# Patient Record
Sex: Female | Born: 1935 | Race: White | Hispanic: No
Health system: Southern US, Community
[De-identification: ages and names within clinical notes are randomized; demographics above are authoritative.]

---

## 2003-12-10 ENCOUNTER — Ambulatory Visit: Payer: Self-pay | Admitting: Internal Medicine

## 2005-01-26 ENCOUNTER — Ambulatory Visit: Payer: Self-pay | Admitting: Internal Medicine

## 2005-03-12 ENCOUNTER — Emergency Department: Payer: Self-pay | Admitting: Emergency Medicine

## 2006-12-03 ENCOUNTER — Emergency Department: Payer: Self-pay | Admitting: Emergency Medicine

## 2006-12-03 ENCOUNTER — Other Ambulatory Visit: Payer: Self-pay

## 2006-12-19 ENCOUNTER — Other Ambulatory Visit: Payer: Self-pay

## 2006-12-19 ENCOUNTER — Emergency Department: Payer: Self-pay | Admitting: Emergency Medicine

## 2007-01-18 ENCOUNTER — Inpatient Hospital Stay: Payer: Self-pay | Admitting: Internal Medicine

## 2007-01-18 ENCOUNTER — Other Ambulatory Visit: Payer: Self-pay

## 2007-01-19 ENCOUNTER — Other Ambulatory Visit: Payer: Self-pay

## 2007-01-20 ENCOUNTER — Other Ambulatory Visit: Payer: Self-pay

## 2009-09-13 IMAGING — CT CT HEAD WITHOUT CONTRAST
2 series · 16 of 30 positions shown, 20 images · non-contrast
Comparison: none

REASON FOR EXAM: dizzy
COMMENTS:

PROCEDURE:     CT  - CT HEAD WITHOUT CONTRAST  - December 03, 2006 [DATE]
RESULT:
HISTORY: Dizzy.
COMPARISON STUDIES: No recent.
PROCEDURE AND FINDINGS: No intra-axial or extra-axial pathologic fluid or
blood collections identified.  No mass lesions or hydrocephalus is noted. No
bony abnormalities are identified. There is no evidence of fracture.

[Series 2: without · axial · non-contrast · 0.46mm/px · z∈[-128,-4]mm · 13 of 31 slices shown, 17 images]
[im 3/31  brain]
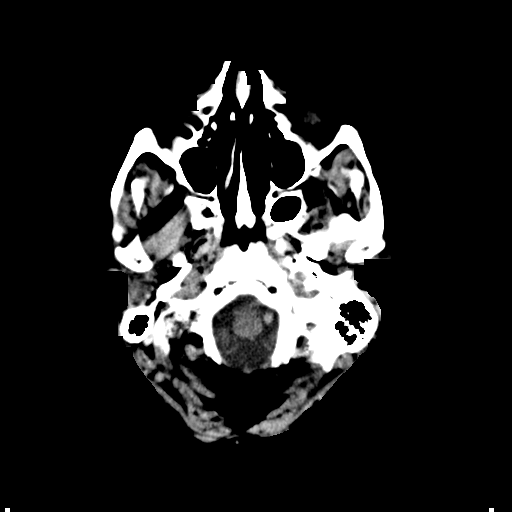
[im 3/31  bone]
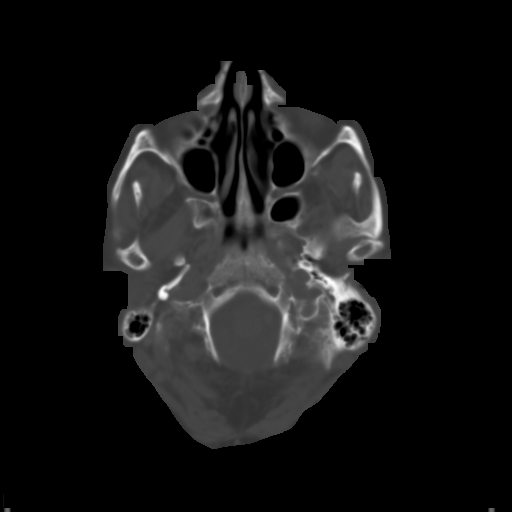
[im 5/31  brain]
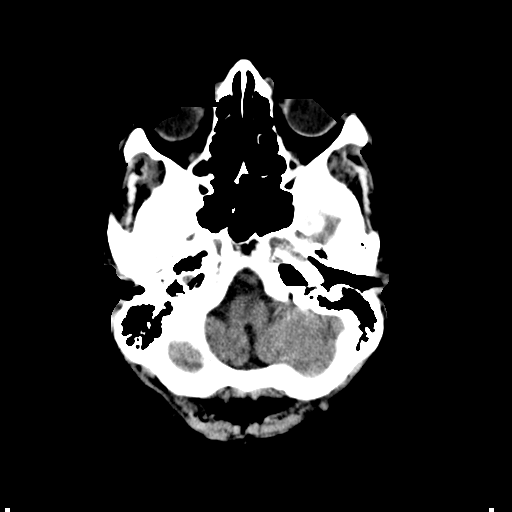
[im 7/31  brain]
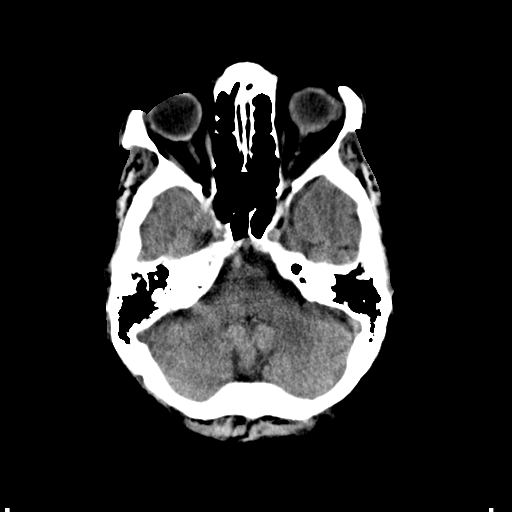
[im 9/31  brain]
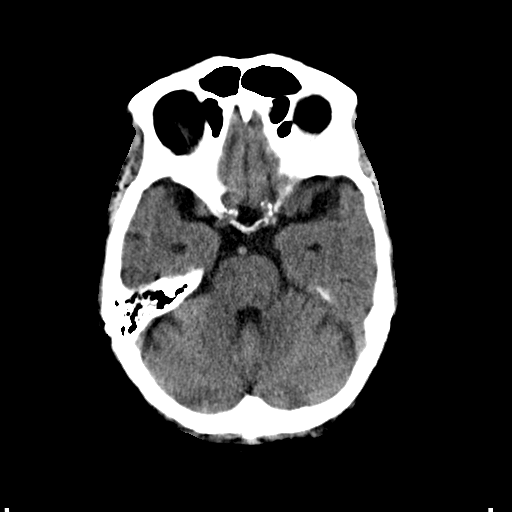
[im 11/31  brain]
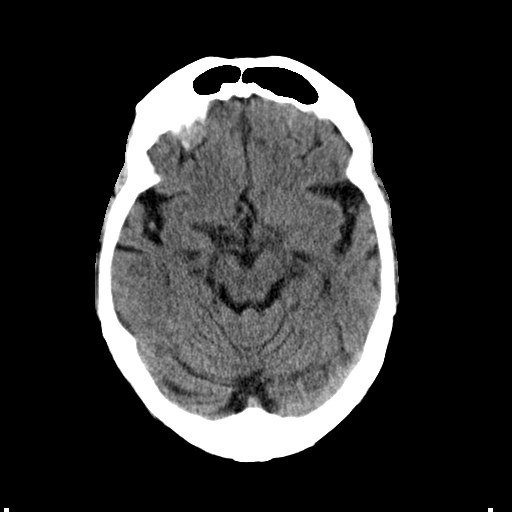
[im 11/31  bone]
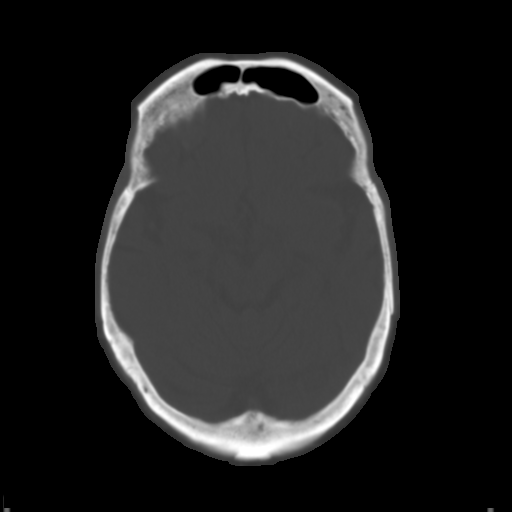
[im 13/31  brain]
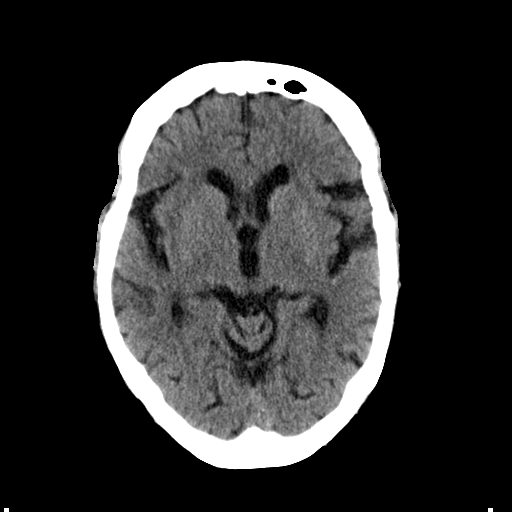
[im 16/31  brain]
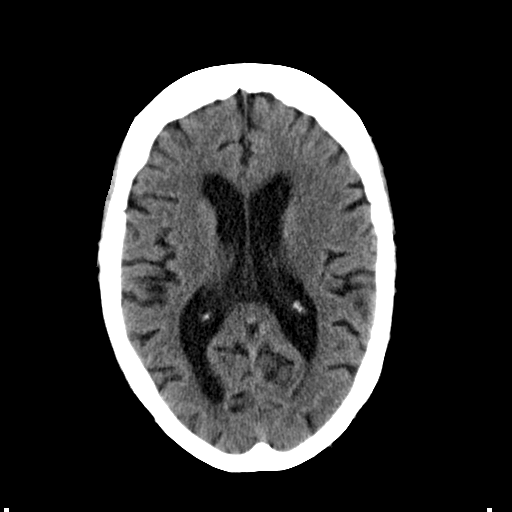
[im 18/31  brain]
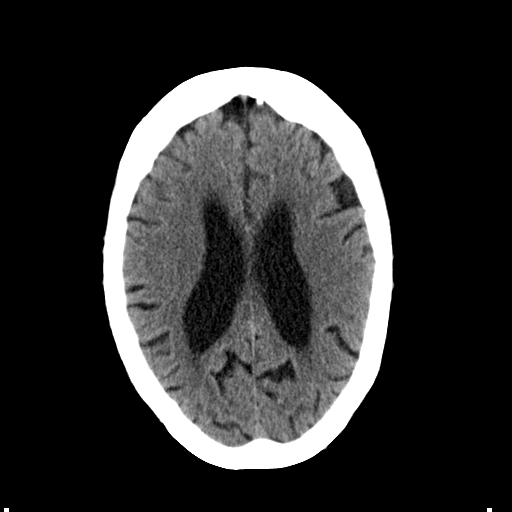
[im 20/31  brain]
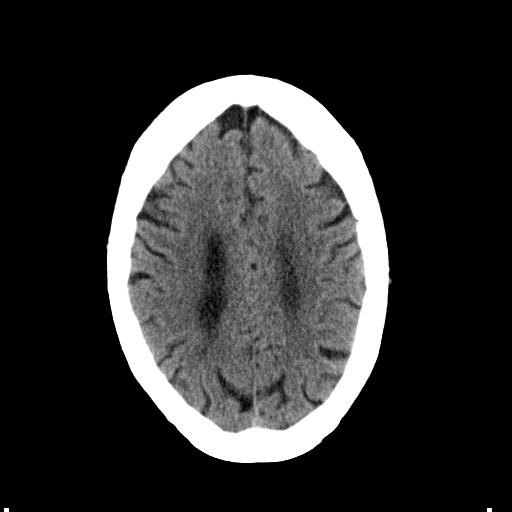
[im 20/31  bone]
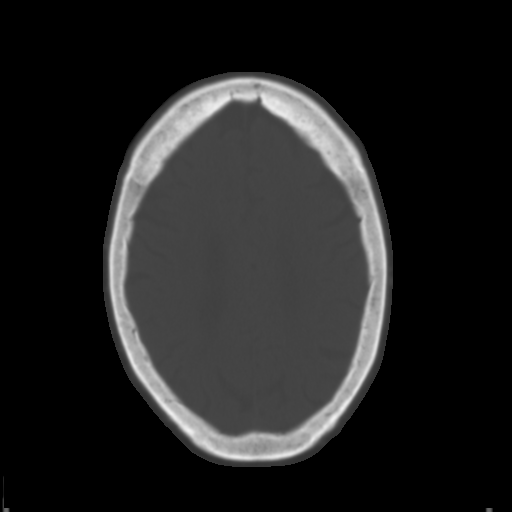
[im 22/31  brain]
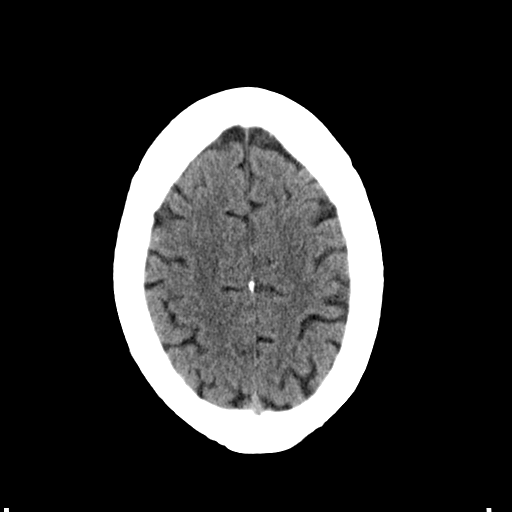
[im 24/31  brain]
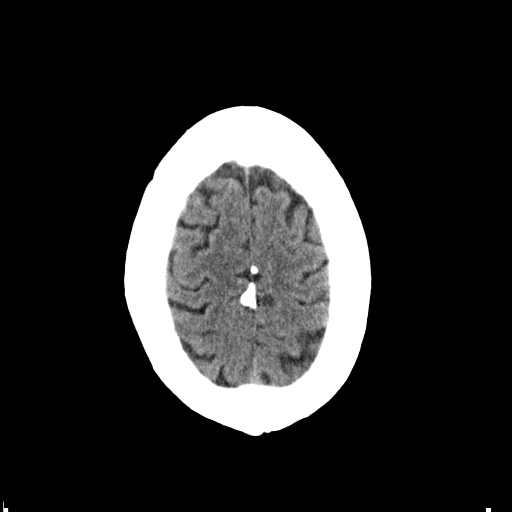
[im 26/31  brain]
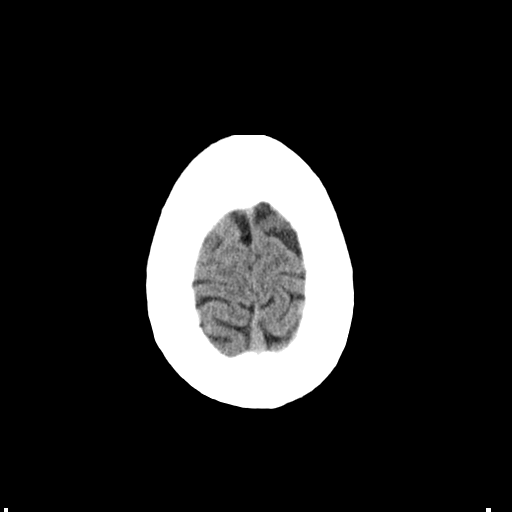
[im 28/31  brain]
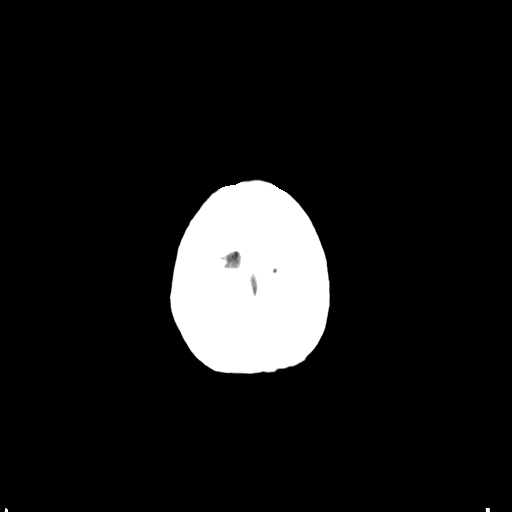
[im 28/31  bone]
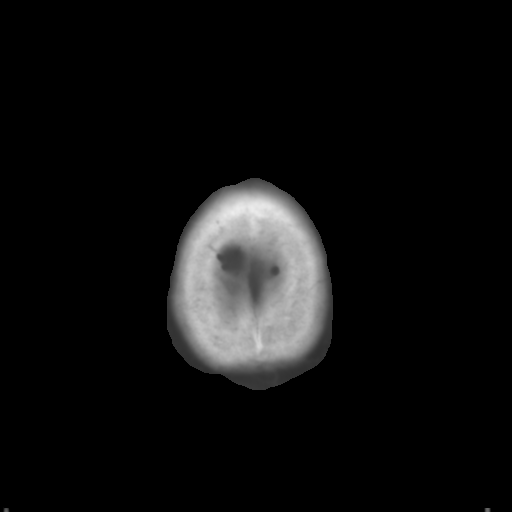

[Series 3: bone · axial · 0.46mm/px · z∈[-128,-88]mm · 3 of 31 slices shown]
[im 3/31  bone]
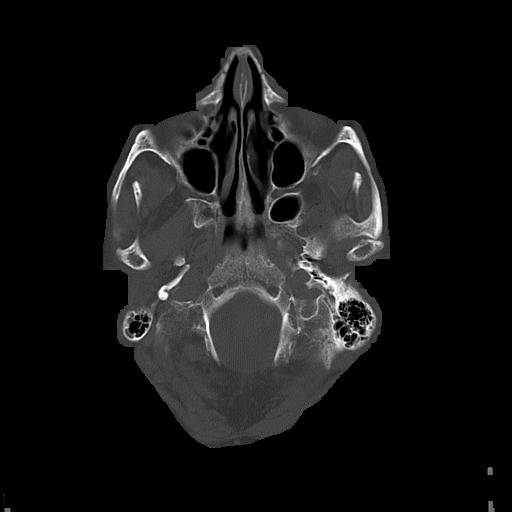
[im 7/31  bone]
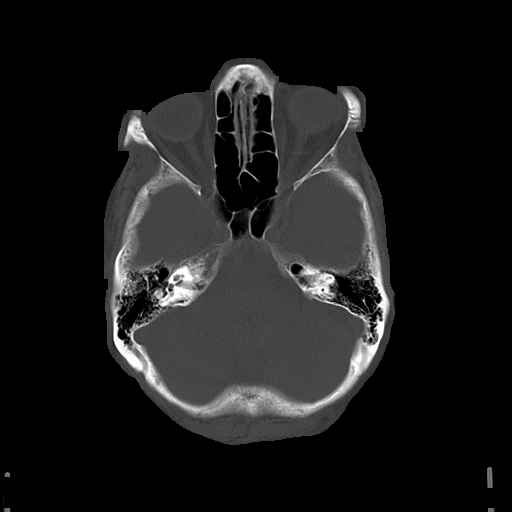
[im 11/31  bone]
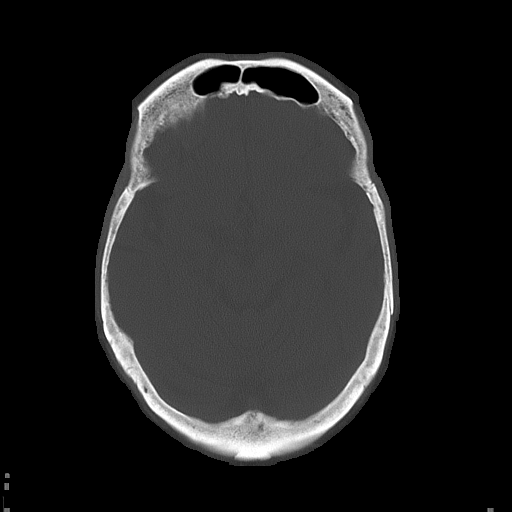

[16 of 30 positions shown; findings below may reference images not displayed]

IMPRESSION: 1)No acute abnormalities identified.

## 2009-09-29 IMAGING — CT CT HEAD WITHOUT CONTRAST
1 series · 16 of 30 positions shown, 20 images · non-contrast
Comparison: none

REASON FOR EXAM: syncope
COMMENTS:

PROCEDURE:     CT  - CT HEAD WITHOUT CONTRAST  - December 19, 2006  [DATE]
RESULT:     Comparison: Head CT on 12/03/2006.
Procedure: CT examination of the head was performed without intravenous
contrast. Collimation is 5 mm.

[Series 2: soft tissue · axial · 0.39mm/px · z∈[-272,-132]mm · 16 of 32 slices shown, 20 images]
[im 2/32  brain]
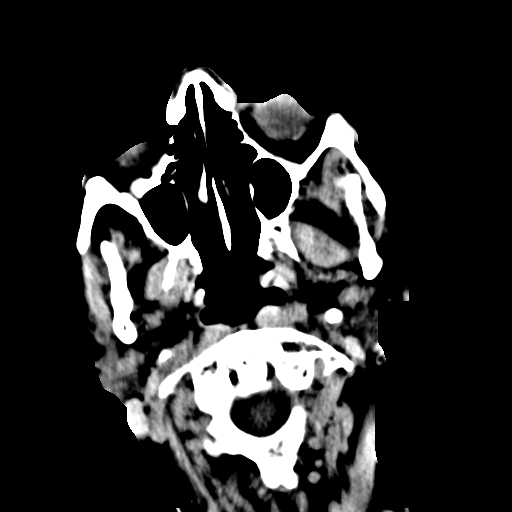
[im 2/32  bone]
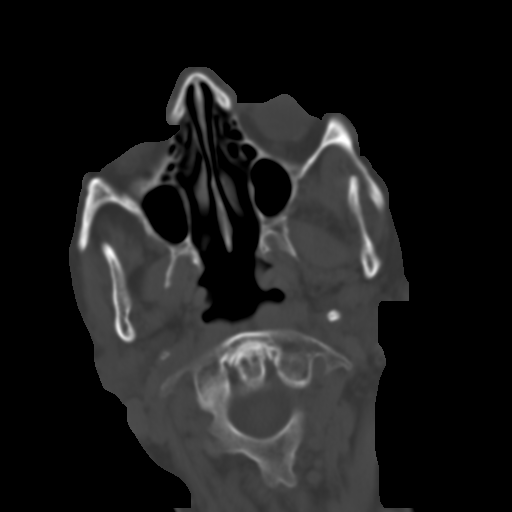
[im 4/32  brain]
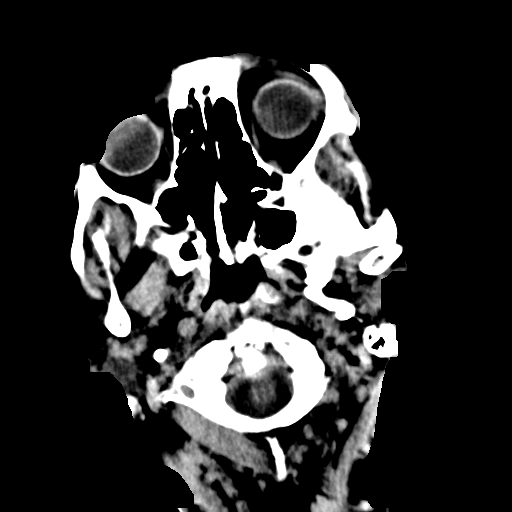
[im 6/32  brain]
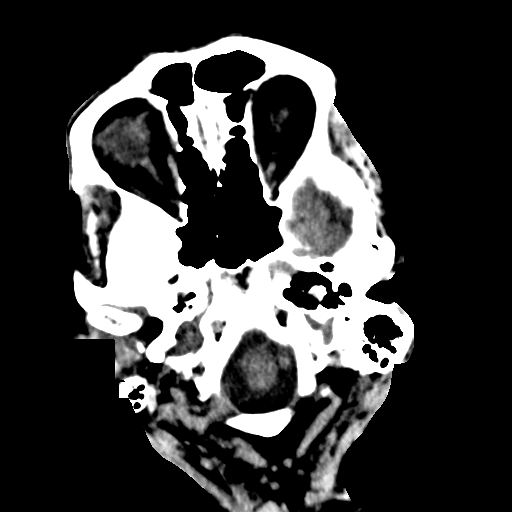
[im 8/32  brain]
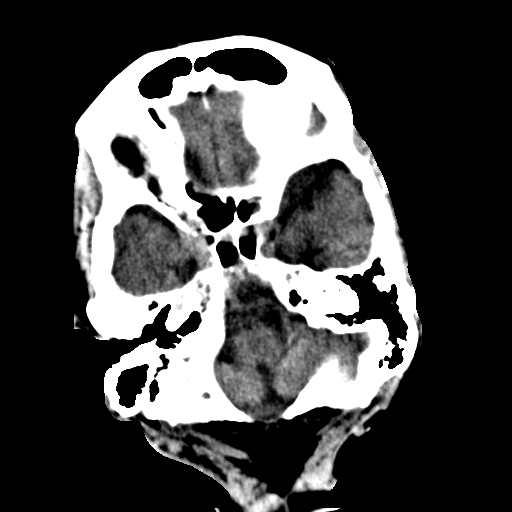
[im 9/32  brain]
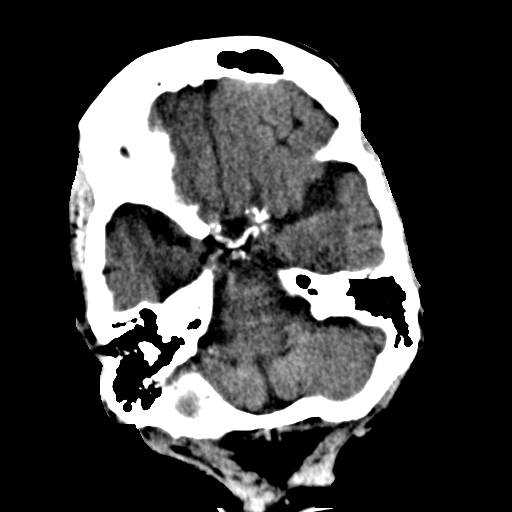
[im 9/32  bone]
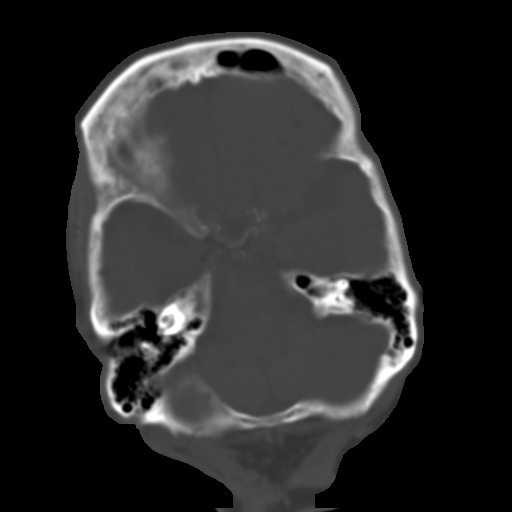
[im 11/32  brain]
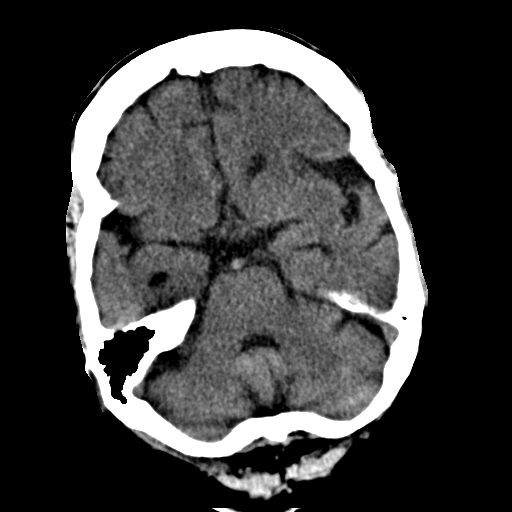
[im 13/32  brain]
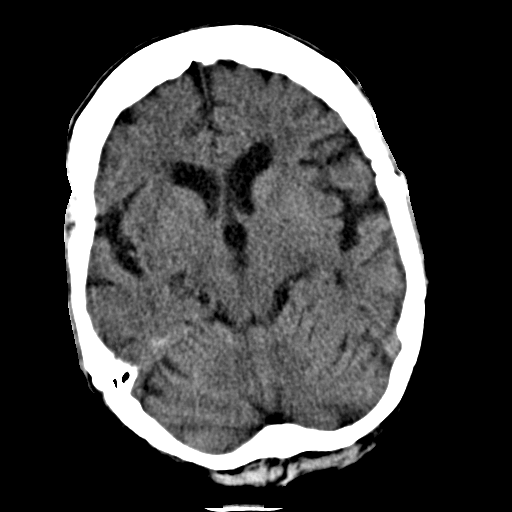
[im 15/32  brain]
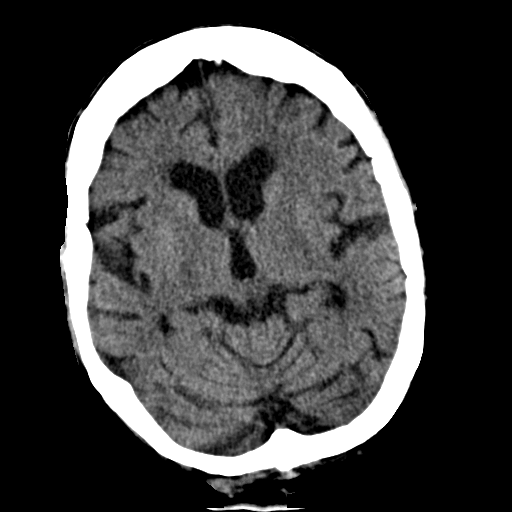
[im 17/32  brain]
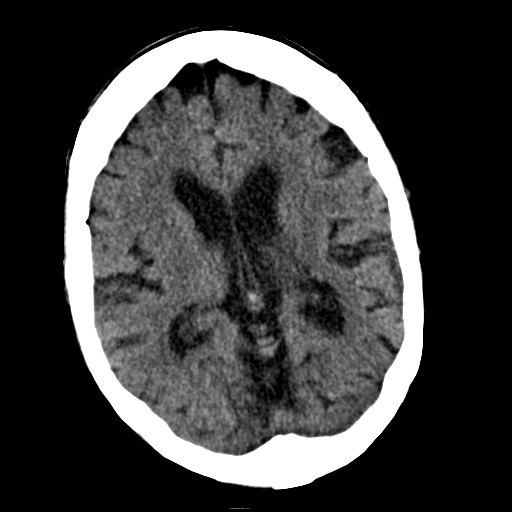
[im 17/32  bone]
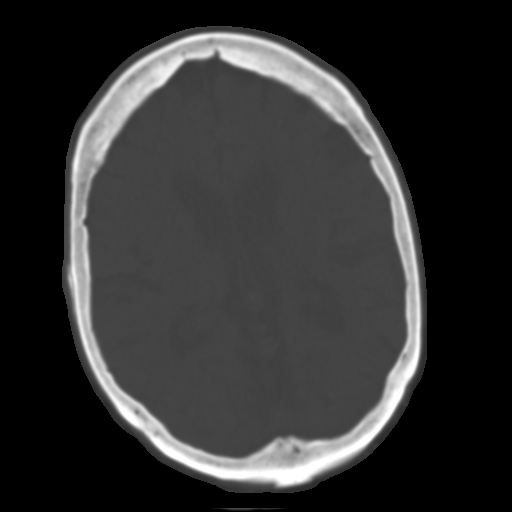
[im 19/32  brain]
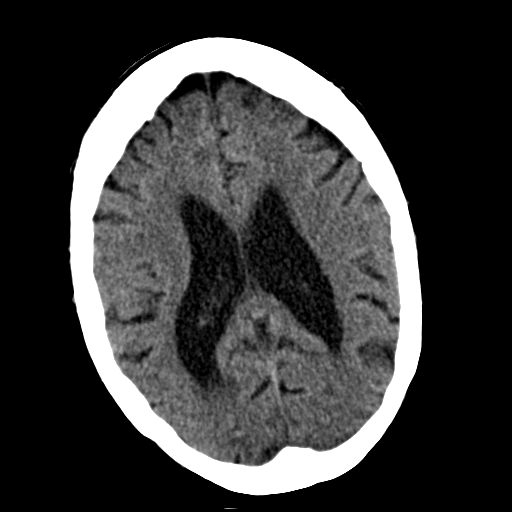
[im 21/32  brain]
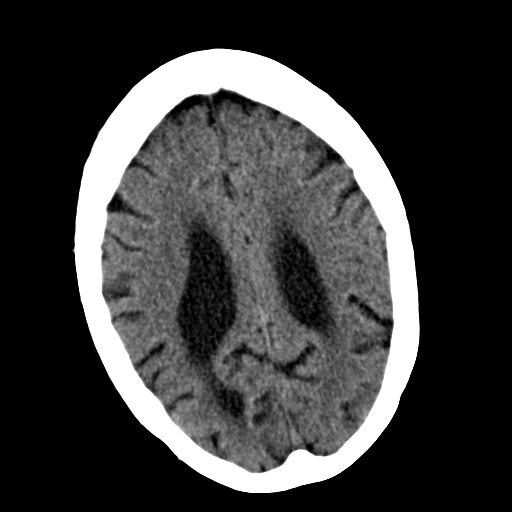
[im 23/32  brain]
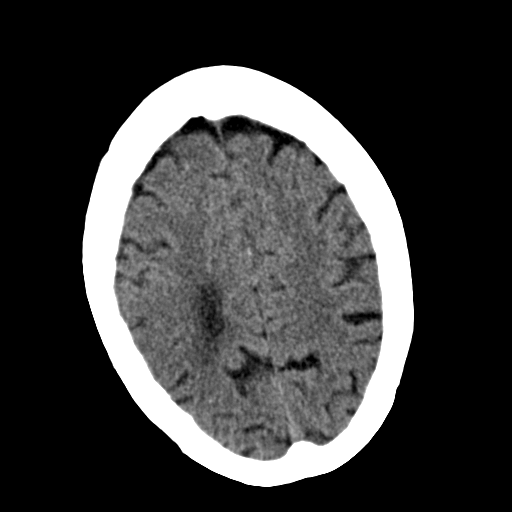
[im 24/32  brain]
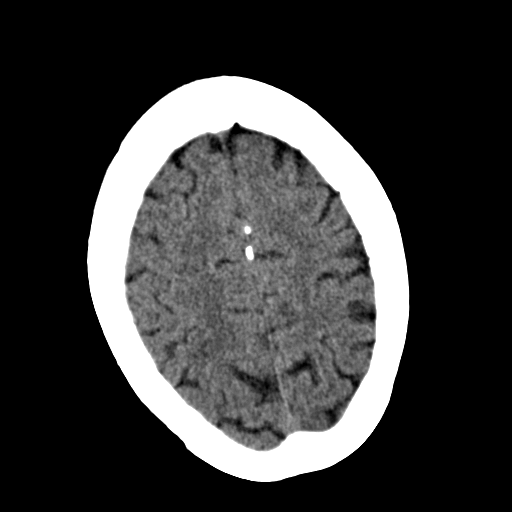
[im 24/32  bone]
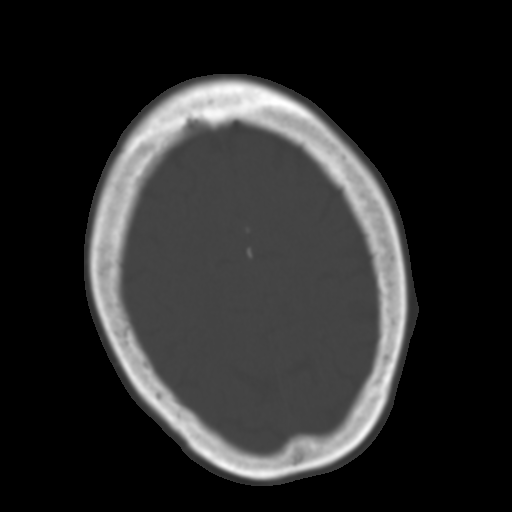
[im 26/32  brain]
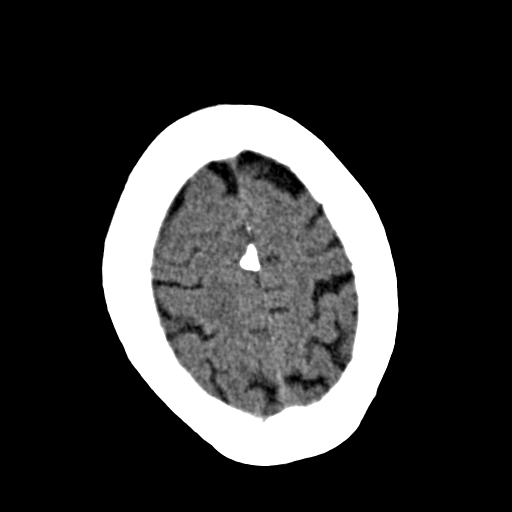
[im 28/32  brain]
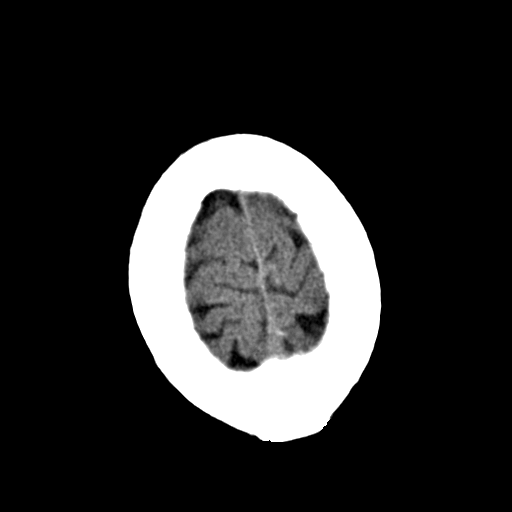
[im 30/32  brain]
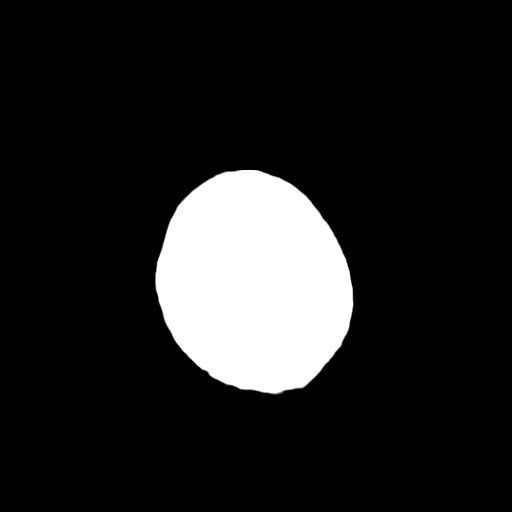

[16 of 30 positions shown; findings below may reference images not displayed]

FINDINGS: No evidence of intracranial hemorrhage, mass-effect, or ventricular
dilatation. Minimal bilateral white matter low attenuation is noted,
nonspecific, but is most commonly due to chronic small vessel changes. No
displaced calvarial fracture is noted. The visualized paranasal sinuses and
mastoid air cells are unremarkable.
IMPRESSION: 1. No significant interval change.

## 2009-09-29 IMAGING — CR DG CHEST 1V PORT
1 series · 1 of 1 positions shown · non-contrast
Comparison: none

REASON FOR EXAM: Syncope
COMMENTS:

[view not recorded]
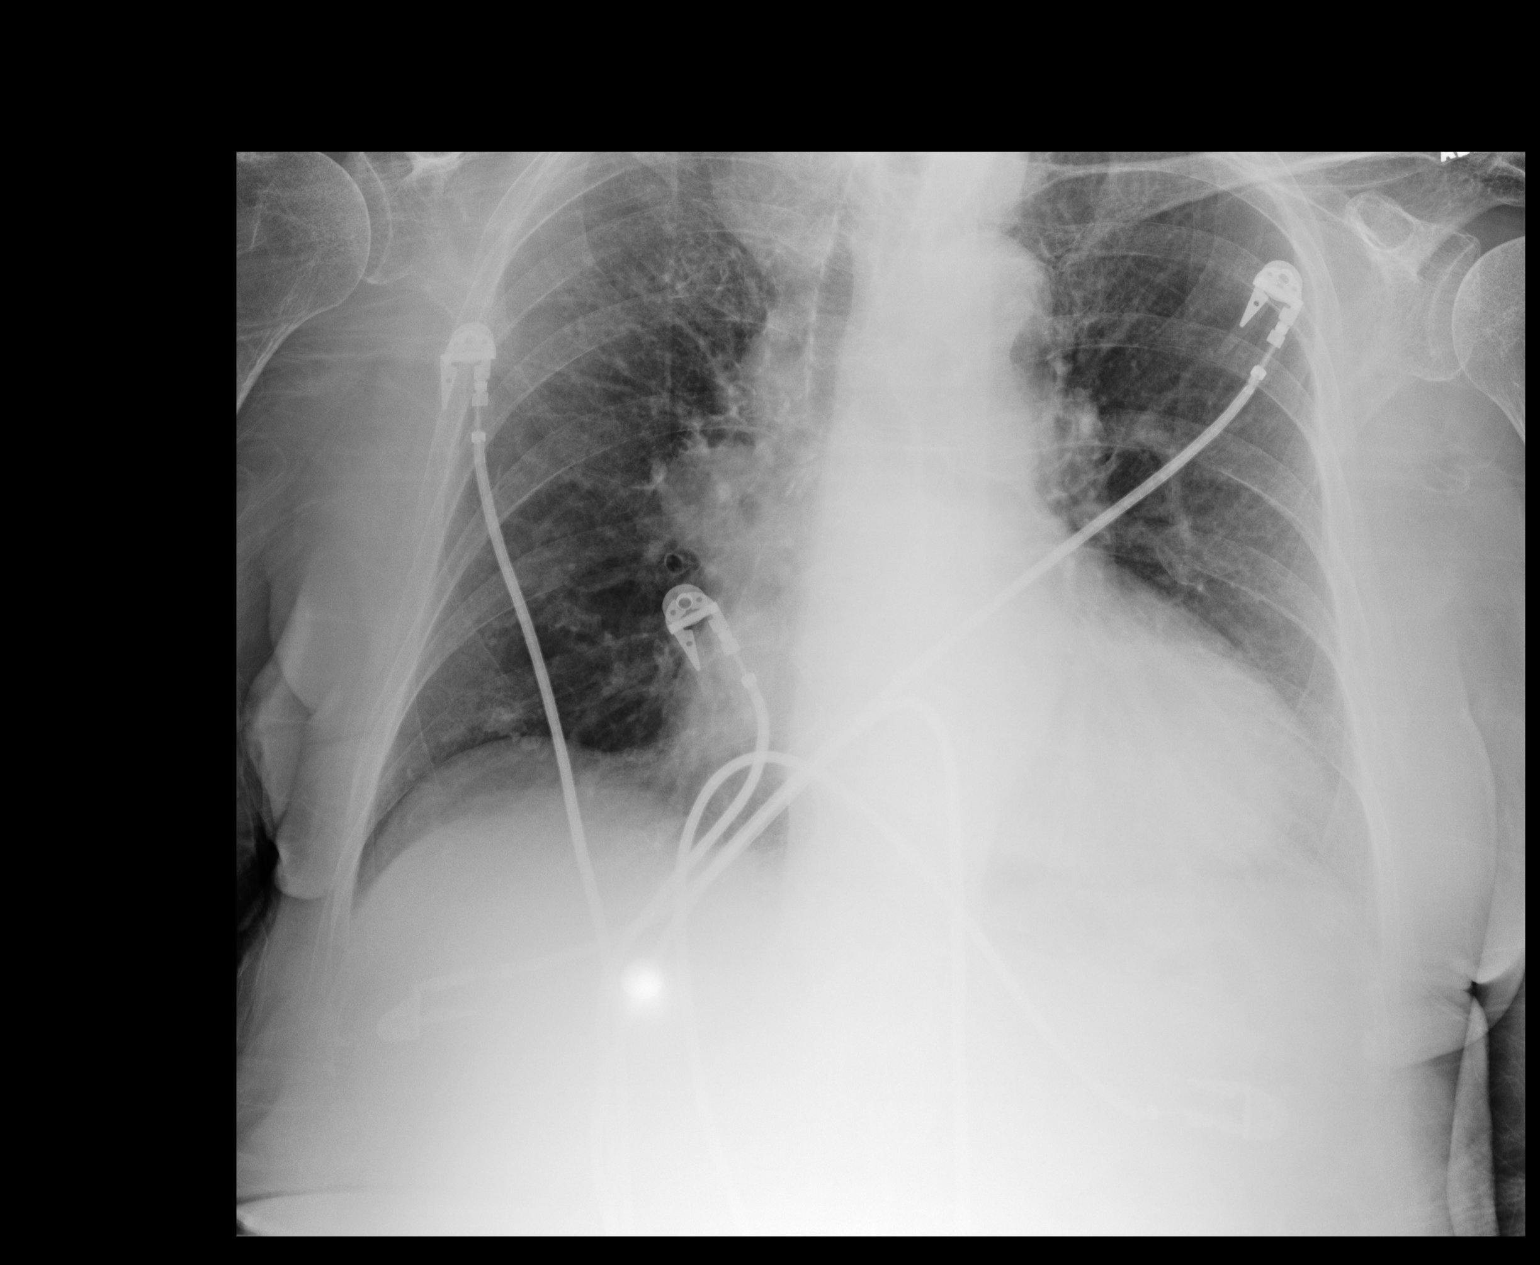

[1 of 1 positions shown; findings below may reference images not displayed]

PROCEDURE:     DXR - DXR PORTABLE CHEST SINGLE VIEW  - December 19, 2006  [DATE]

RESULT:     Frontal view of the chest is performed.

The patient has taken a shallow inspiration. Focal areas of nodular
mass-like density project at the level of the carina as well as within the
RIGHT hilar region. These areas are suspicious for masses and further
evaluation with chest CT is recommended. There is thickening of the
interstitial markings. The cardiac silhouette is enlarged. The visualized
bony skeleton is osteopenic.
IMPRESSION: 1.     Findings suspicious for RIGHT hilar and RIGHT paraspinous masses.
Further evaluation with chest CT is recommended.
2.     Interstitial findings versus possibly an element of pulmonary
vascular congestion.

## 2010-05-16 DEATH — deceased

## 2018-02-16 NOTE — Telephone Encounter (Signed)
Opened pt chart in error.  No follow up phone call was made.  maw
